# Patient Record
Sex: Male | Born: 1960 | Race: Black or African American | Hispanic: No | Marital: Single
Health system: Southern US, Community
[De-identification: ages and names within clinical notes are randomized; demographics above are authoritative.]

---

## 2018-09-03 ENCOUNTER — Other Ambulatory Visit: Payer: Self-pay

## 2018-09-03 ENCOUNTER — Emergency Department (HOSPITAL_COMMUNITY): Payer: Self-pay

## 2018-09-03 ENCOUNTER — Emergency Department (HOSPITAL_COMMUNITY)
Admission: EM | Admit: 2018-09-03 | Discharge: 2018-09-03 | Disposition: A | Discharge status: Home / Self Care | Payer: Self-pay | Attending: Emergency Medicine | Admitting: Emergency Medicine

## 2018-09-03 DIAGNOSIS — S5001XA Contusion of right elbow, initial encounter: Secondary | ICD-10-CM | POA: Insufficient documentation

## 2018-09-03 DIAGNOSIS — Y999 Unspecified external cause status: Secondary | ICD-10-CM | POA: Insufficient documentation

## 2018-09-03 DIAGNOSIS — Y92009 Unspecified place in unspecified non-institutional (private) residence as the place of occurrence of the external cause: Secondary | ICD-10-CM

## 2018-09-03 DIAGNOSIS — Y93G3 Activity, cooking and baking: Secondary | ICD-10-CM | POA: Insufficient documentation

## 2018-09-03 DIAGNOSIS — W010XXA Fall on same level from slipping, tripping and stumbling without subsequent striking against object, initial encounter: Secondary | ICD-10-CM | POA: Insufficient documentation

## 2018-09-03 DIAGNOSIS — W19XXXA Unspecified fall, initial encounter: Secondary | ICD-10-CM

## 2018-09-03 DIAGNOSIS — Y92000 Kitchen of unspecified non-institutional (private) residence as  the place of occurrence of the external cause: Secondary | ICD-10-CM | POA: Insufficient documentation

## 2018-09-03 DIAGNOSIS — S39012A Strain of muscle, fascia and tendon of lower back, initial encounter: Secondary | ICD-10-CM | POA: Insufficient documentation

## 2018-09-03 DIAGNOSIS — M545 Low back pain, unspecified: Secondary | ICD-10-CM

## 2018-09-03 MED ORDER — CYCLOBENZAPRINE HCL 10 MG PO TABS: 10.0000 mg | ORAL_TABLET | Freq: Every day | ORAL | 0 refills | Status: AC

## 2018-09-03 MED ORDER — TRAMADOL HCL 50 MG PO TABS
50.0000 mg | ORAL_TABLET | Freq: Once | ORAL | Status: AC
  Administered 2018-09-03: 50 mg via ORAL
  Filled 2018-09-03: qty 1

## 2018-09-03 MED ORDER — KETOROLAC TROMETHAMINE 60 MG/2ML IM SOLN
60.0000 mg | Freq: Once | INTRAMUSCULAR | Status: AC
  Administered 2018-09-03: 60 mg via INTRAMUSCULAR
  Filled 2018-09-03: qty 2

## 2018-09-03 MED ORDER — TRAMADOL HCL 50 MG PO TABS: 50.0000 mg | ORAL_TABLET | Freq: Four times a day (QID) | ORAL | 0 refills | Status: AC | PRN

## 2018-09-03 NOTE — ED Notes (Signed)
Pt stating he is in pain to Clinical research associatewriter. Cletis AthensJon, RN notified.

## 2018-09-03 NOTE — Discharge Instructions (Signed)
Your x-rays did not show any abnormalities.  Return here as needed.  Follow-up with a primary doctor.  Use ice and heat on the areas that are sore.

## 2018-09-03 NOTE — ED Provider Notes (Signed)
[facility]-EMERGENCY DEPT Provider Note   CSN: [id number] Arrival date & time: 09/03/18  1927     History   Chief Complaint Chief Complaint  Patient presents with  . Fall  . Back Pain    HPI Calvin Sims is a 57 y.o. male.  HPI Patient presents to the emergency department with injuries following a fall.  The patient states that he slipped and fell while in the kitchen helping cook.  The patient states that he landed against a dish pan.  The patient states that nothing seems to make the condition better but certain movements palpation make the pain worse.  She is complaining of lower back pain along with right low pain.  Patient denies any other injuries at this time.  Patient states he did not hit his head or lose consciousness.  Patient denies any numbness weakness incontinence, dysuria, chest pain, shortness of breath or syncope. No past medical history on file.  There are no active problems to display for this patient.        Home Medications    Prior to Admission medications   Not on File    Family History No family history on file.  Social History Social History   Tobacco Use  . Smoking status: Not on file  Substance Use Topics  . Alcohol use: Not on file  . Drug use: Not on file     Allergies   Patient has no known allergies.   Review of Systems Review of Systems All other systems negative except as documented in the HPI. All pertinent positives and negatives as reviewed in the HPI.  Physical Exam Updated Vital Signs BP (!) 148/100 (BP Location: Right Arm) Comment: Jon, RN aware  Pulse 62   Temp 97.8 F (36.6 C)   Resp (!) 61   Ht 5\' 7"  (1.702 m)   Wt 83.9 kg   SpO2 100%   BMI 28.98 kg/m   Physical Exam  Constitutional: He is oriented to person, place, and time. He appears well-developed and well-nourished. No distress.  HENT:  Head: Normocephalic and atraumatic.  Mouth/Throat: Oropharynx is clear and moist.    Eyes: Pupils are equal, round, and reactive to light.  Neck: Normal range of motion. Neck supple.  Cardiovascular: Normal rate, regular rhythm and normal heart sounds. Exam reveals no gallop and no friction rub.  No murmur heard. Pulmonary/Chest: Effort normal and breath sounds normal. No respiratory distress. He has no wheezes.  Musculoskeletal:       Right elbow: He exhibits normal range of motion and no swelling. Tenderness found.       Lumbar back: He exhibits pain and spasm. He exhibits no laceration.       Back:  Neurological: He is alert and oriented to person, place, and time. No sensory deficit. He exhibits normal muscle tone. Coordination normal.  Skin: Skin is warm and dry. Capillary refill takes less than 2 seconds. No rash noted. No erythema.  Psychiatric: He has a normal mood and affect. His behavior is normal.  Nursing note and vitals reviewed.    ED Treatments / Results  Labs (all labs ordered are listed, but only abnormal results are displayed) Labs Reviewed - No data to display  EKG None  Radiology Dg Lumbar Spine Complete  Result Date: 09/03/2018 CLINICAL DATA:  Low back pain following a fall in the kitchen this morning. EXAM: LUMBAR SPINE - COMPLETE 4+ VIEW COMPARISON:  None. FINDINGS: Five non-rib-bearing lumbar vertebrae. Disc  space narrowing and anterior, posterior and lateral spur formation at the L2-3, L3-4 and L4-5 levels. Facet degenerative changes at the L5-S1 level. No fractures, pars defects or subluxations. 2.5 cm sclerotic and lucent lesion in the left iliac bone medially. This has well-defined, mildly irregular margins with peripheral sclerosis. Costovertebral degenerative changes at the T10 level. IMPRESSION: 1. No fracture or subluxation. 2. Multilevel degenerative changes. 3. 2.5 cm nonspecific lesion in the left iliac bone. This has nonaggressive features. Electronically Signed   By: Beckie Salts M.D.   On: 09/03/2018 21:25   Dg Forearm  Left  Result Date: 09/03/2018 CLINICAL DATA:  Left elbow pain after slipping and falling. EXAM: LEFT FOREARM - 2 VIEW COMPARISON:  None. FINDINGS: There is no evidence of fracture or other focal bone lesions. Soft tissues are unremarkable. IMPRESSION: No fracture or dislocation seen. Electronically Signed   By: Beckie Salts M.D.   On: 09/03/2018 21:20    Procedures Procedures (including critical care time)  Medications Ordered in ED Medications  ketorolac (TORADOL) injection 60 mg (has no administration in time range)  traMADol (ULTRAM) tablet 50 mg (has no administration in time range)     Initial Impression / Assessment and Plan / ED Course  I have reviewed the triage vital signs and the nursing notes.  Pertinent labs & imaging results that were available during my care of the patient were reviewed by me and considered in my medical decision making (see chart for details).     Patient has no x-ray findings that are abnormal at this time.  He does not have any neurological deficits noted.  Patient has no numbness or weakness in his extremities.  Patient is able to ambulate without significant difficulties.  Final Clinical Impressions(s) / ED Diagnoses   Final diagnoses:  None    ED Discharge Orders    None       Charlestine Night, PA-C 09/03/18 2300    Arby Barrette, MD 09/04/18 [phone]

## 2018-09-03 NOTE — ED Triage Notes (Addendum)
Pt to ED with c/o of a slip and fall in the kitchen with lower back pain. Pt states pain is throbbing/grabbing back pain in center of lumbar area. Pt also states left elbow where he hit against ice machine hurts as well. Pt is not on any blood thinners, no neck pain, and denies hitting head. Pain is 8/10 when moving and 6/10 sitting still. Pt is able to move all extremities and walked to Ambulance. Pt is A&O x4.

## 2019-05-25 IMAGING — CR DG FOREARM 2V*L*
3 series · 3 of 3 positions shown · non-contrast
Comparison: None.

CLINICAL DATA: Left elbow pain after slipping and falling.

EXAM:
LEFT FOREARM - 2 VIEW

[x forearm ap left]
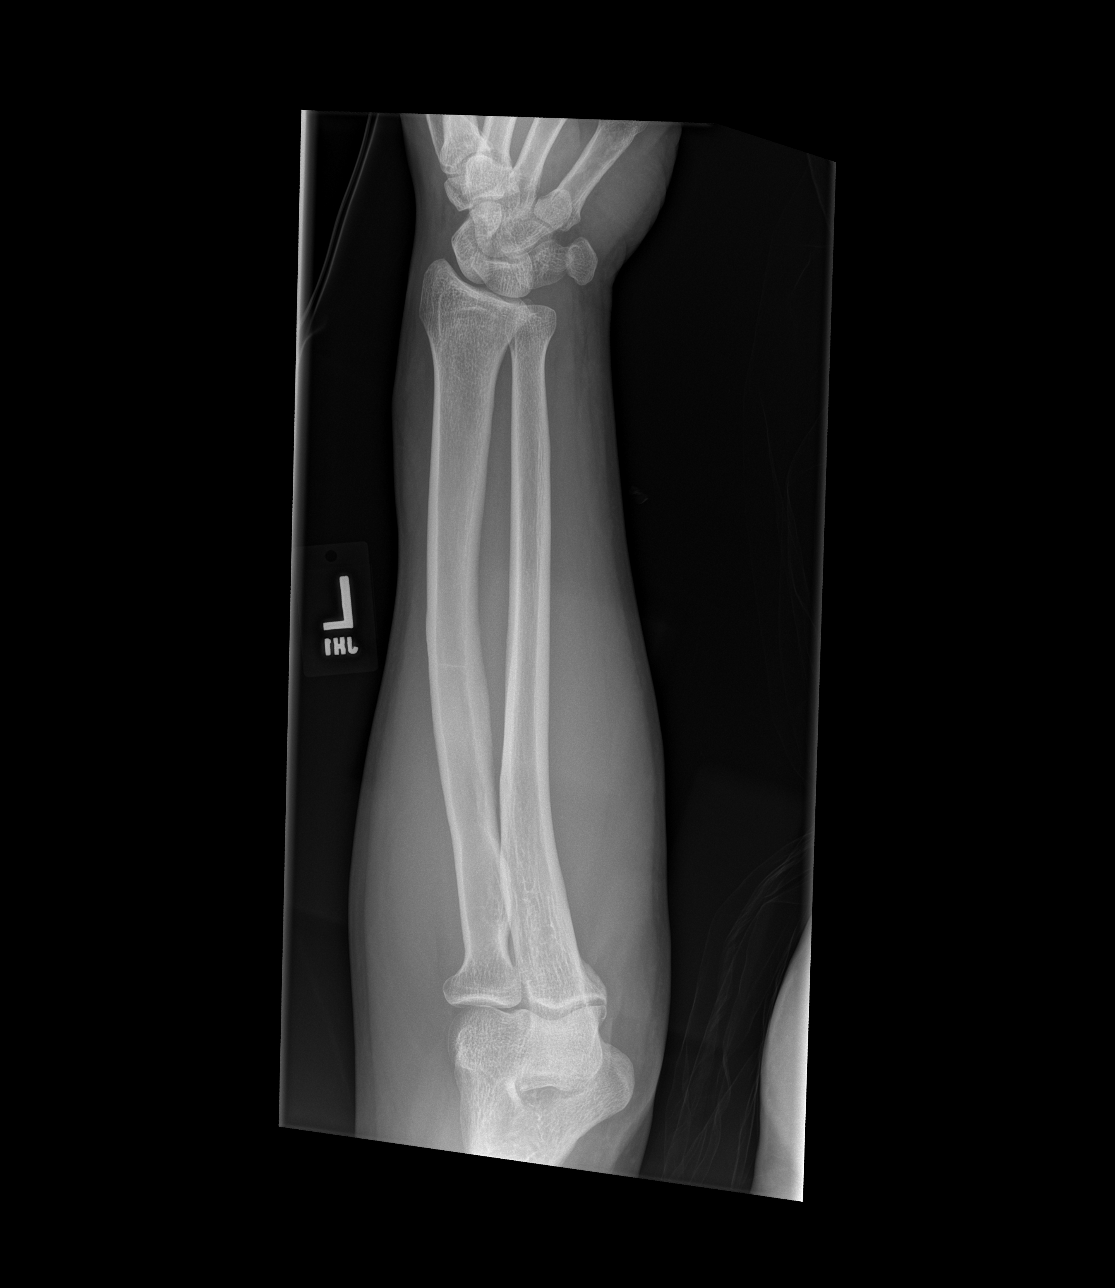

[x forearm lat left (1 of 2)]
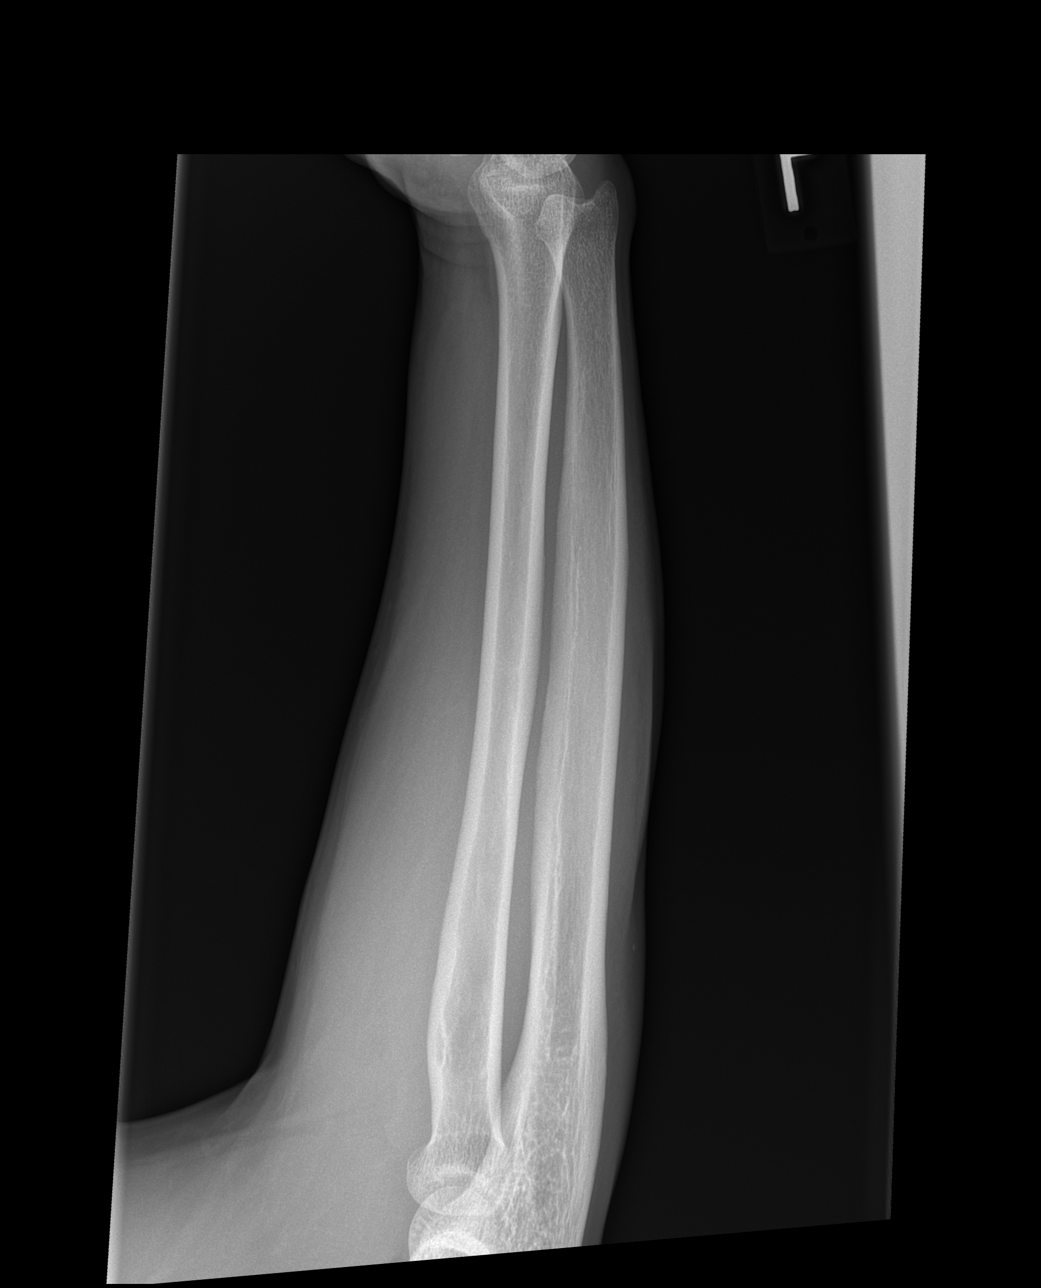

[x forearm lat left (2 of 2)]
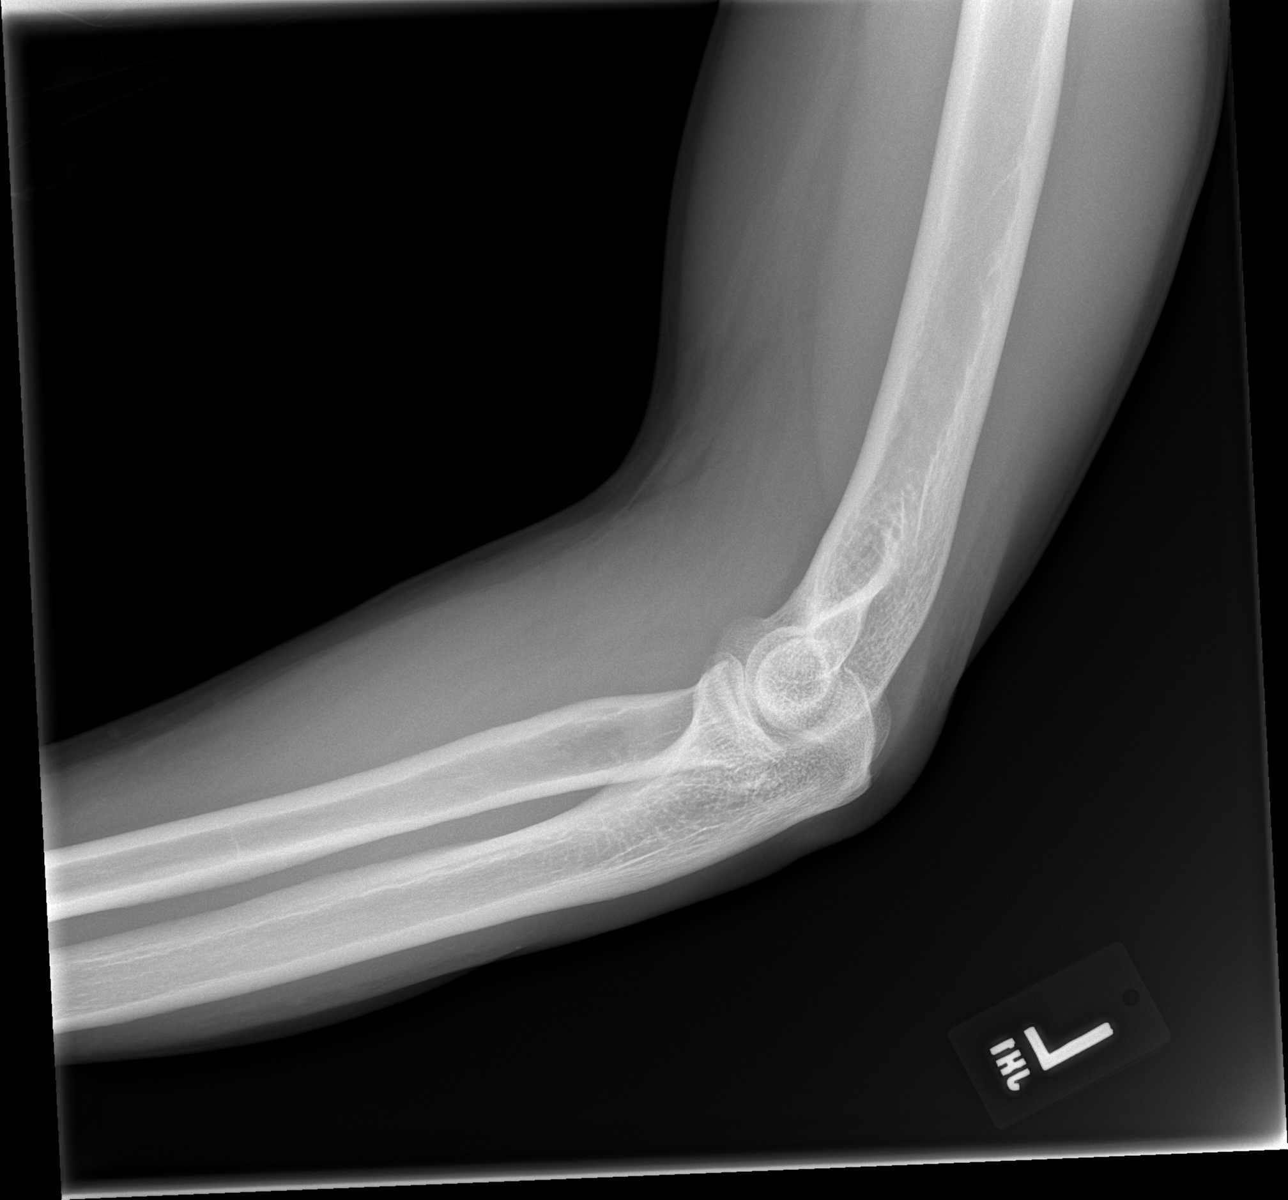

[3 of 3 positions shown; findings below may reference images not displayed]

FINDINGS: There is no evidence of fracture or other focal bone lesions. Soft
tissues are unremarkable.
IMPRESSION: No fracture or dislocation seen.
